# Patient Record
Sex: Male | Born: 1980 | Race: Black or African American | Hispanic: No | Marital: Single | Smoking: Current every day smoker
Health system: Southern US, Community
[De-identification: ages and names within clinical notes are randomized; demographics above are authoritative.]

---

## 2011-02-28 ENCOUNTER — Encounter (HOSPITAL_BASED_OUTPATIENT_CLINIC_OR_DEPARTMENT_OTHER): Payer: Self-pay | Admitting: *Deleted

## 2011-02-28 ENCOUNTER — Emergency Department (HOSPITAL_BASED_OUTPATIENT_CLINIC_OR_DEPARTMENT_OTHER)
Admission: EM | Admit: 2011-02-28 | Discharge: 2011-02-28 | Disposition: A | Discharge status: Home / Self Care | Payer: Self-pay | Attending: Emergency Medicine | Admitting: Emergency Medicine

## 2011-02-28 DIAGNOSIS — F17200 Nicotine dependence, unspecified, uncomplicated: Secondary | ICD-10-CM | POA: Insufficient documentation

## 2011-02-28 DIAGNOSIS — R109 Unspecified abdominal pain: Secondary | ICD-10-CM | POA: Insufficient documentation

## 2011-02-28 LAB — URINALYSIS, ROUTINE W REFLEX MICROSCOPIC
Glucose, UA: NEGATIVE mg/dL
Ketones, ur: NEGATIVE mg/dL
Leukocytes, UA: NEGATIVE
Nitrite: NEGATIVE
pH: 7 (ref 5.0–8.0)

## 2011-02-28 NOTE — ED Provider Notes (Signed)
History  This chart was scribed for Doug Sou, MD by Bennett Scrape. This patient was seen in room MH10/MH10 and the patient's care was started at 4:22PM.  CSN: [id number]  Arrival date & time 02/28/11  1535   First MD Initiated Contact with Patient 02/28/11 1616      Chief Complaint  Patient presents with  . Abdominal Pain    Patient is a 31 y.o. male presenting with flank pain. The history is provided by the patient. No language interpreter was used.  Flank Pain This is a new problem. The current episode started more than 2 days ago. The problem occurs hourly. The problem has not changed since onset.Pertinent negatives include no chest pain, no abdominal pain, no headaches and no shortness of breath. The symptoms are relieved by medications.    Eric Wilkins is a 31 y.o. male who presents to the Emergency Department complaining of one week of gradual onset, non-changing, non-radiating left flank pain described as a tight feeling. He reports that the pain lasts in 2 to 3 minute episodes and goes away for one hour. Pt states that the symptoms have been subsiding since arriving to the ED. Pt states that laying down aggravates the pain and that movement improves the pain. He has been taking Advil and goody powder with moderate improvement in symptoms. He states that he was seen at another ED for similar symptoms last year and was told that the symptoms were muscle spasms. He reports that the previous symptoms resolved on their own. Pt also c/o stools that are softer than normal and oily appearing. Pt states that has been eating a lot of fast food lately. Pt denies dysuria, appetite change, and SOB as associated symptoms. Pt is a smoker and occasional drinker. He denies illegal drug use.   History reviewed. No pertinent past medical history.  History reviewed. No pertinent past surgical history.  History reviewed. No pertinent family history.  History  Substance Use Topics  .  Smoking status: Current Everyday Smoker  . Smokeless tobacco: Not on file  . Alcohol Use: Yes      Review of Systems  Constitutional: Negative for appetite change.  HENT: Negative for sore throat and rhinorrhea.   Respiratory: Negative for cough and shortness of breath.   Cardiovascular: Negative for chest pain.  Gastrointestinal: Negative for abdominal pain.  Genitourinary: Positive for flank pain (Left-sided).  Skin: Negative for rash.  Neurological: Negative for headaches.  All other systems reviewed and are negative.    Allergies  Review of patient's allergies indicates no known allergies.  Home Medications   Current Outpatient Rx  Name Route Sig Dispense Refill  . ASPIRIN-ACETAMINOPHEN 500-325 MG PO PACK Oral Take 1 packet by mouth 2 (two) times daily as needed. For back pain      Triage Vitals: BP 124/85  Pulse 76  Temp(Src) 97.8 F (36.6 C) (Oral)  Resp 18  Ht 5\' 11"  (1.803 m)  Wt 154 lb (69.854 kg)  BMI 21.48 kg/m2  SpO2 99%  Physical Exam  Nursing note and vitals reviewed. Constitutional: He is oriented to person, place, and time. He appears well-developed and well-nourished.  HENT:  Head: Normocephalic and atraumatic.  Mouth/Throat: Oropharynx is clear and moist.  Eyes: Conjunctivae and EOM are normal.  Neck: Normal range of motion. Neck supple.  Cardiovascular: Normal rate, regular rhythm and normal heart sounds.   No murmur heard. Pulmonary/Chest: Effort normal and breath sounds normal. No respiratory distress.  Lungs are clear to auscultation.   Abdominal: Soft. Bowel sounds are normal. He exhibits no distension. There is no tenderness.  Genitourinary: Prostate normal and penis normal.  Musculoskeletal: Normal range of motion. He exhibits tenderness (Slight left flank pain tenderness).  Neurological: He is alert and oriented to person, place, and time. No cranial nerve deficit.  Skin: Skin is warm and dry.  Psychiatric: He has a normal mood  and affect. His behavior is normal.   Pain at left flank is reproduced when patient sits up from a supine position ED Course  Procedures (including critical care time)  DIAGNOSTIC STUDIES: Oxygen Saturation is 99% on room air, normal by my interpretation.    COORDINATION OF CARE: 4:28PM-Advised pt to continue to use goody powders and to return in one week if symptoms haven't resolved. Counseled pt on smoking cessation.     Labs Reviewed  URINALYSIS, ROUTINE W REFLEX MICROSCOPIC   No results found. . Results for orders placed during the hospital encounter of 02/28/11  URINALYSIS, ROUTINE W REFLEX MICROSCOPIC      Component Value Range   Color, Urine YELLOW  YELLOW    APPearance CLEAR  CLEAR    Specific Gravity, Urine 1.017  1.005 - 1.030    pH 7.0  5.0 - 8.0    Glucose, UA NEGATIVE  NEGATIVE (mg/dL)   Hgb urine dipstick NEGATIVE  NEGATIVE    Bilirubin Urine NEGATIVE  NEGATIVE    Ketones, ur NEGATIVE  NEGATIVE (mg/dL)   Protein, ur NEGATIVE  NEGATIVE (mg/dL)   Urobilinogen, UA 0.2  0.0 - 1.0 (mg/dL)   Nitrite NEGATIVE  NEGATIVE    Leukocytes, UA NEGATIVE  NEGATIVE    No results found.   No diagnosis found.  Declines pain medicine in the emergency department  MDM  Pain felt to be muscular in etiology Plan Tylenol or Advil as needed  Diagnosis left flank pain  I personally performed the services described in this documentation, which was scribed in my presence. The recorded information has been reviewed and considered.     Doug Sou, MD 02/28/11 1640

## 2011-02-28 NOTE — ED Notes (Signed)
Pt c/o left side pain, "oily stools, and shaky x 1 week. Denies other s/s.

## 2011-02-28 NOTE — Discharge Instructions (Signed)
We feel that your pain is most likely due to muscle strain. Take Tylenol or Advil for pain. Return or see an urgent care doctor if not improved in 7-10 days

## 2011-07-19 ENCOUNTER — Emergency Department (HOSPITAL_BASED_OUTPATIENT_CLINIC_OR_DEPARTMENT_OTHER)
Admission: EM | Admit: 2011-07-19 | Discharge: 2011-07-19 | Disposition: A | Discharge status: Home / Self Care | Payer: BC Managed Care – PPO | Attending: Emergency Medicine | Admitting: Emergency Medicine

## 2011-07-19 ENCOUNTER — Emergency Department (HOSPITAL_BASED_OUTPATIENT_CLINIC_OR_DEPARTMENT_OTHER): Payer: BC Managed Care – PPO

## 2011-07-19 ENCOUNTER — Encounter (HOSPITAL_BASED_OUTPATIENT_CLINIC_OR_DEPARTMENT_OTHER): Payer: Self-pay | Admitting: *Deleted

## 2011-07-19 DIAGNOSIS — Y9289 Other specified places as the place of occurrence of the external cause: Secondary | ICD-10-CM | POA: Insufficient documentation

## 2011-07-19 DIAGNOSIS — X500XXA Overexertion from strenuous movement or load, initial encounter: Secondary | ICD-10-CM | POA: Insufficient documentation

## 2011-07-19 DIAGNOSIS — IMO0002 Reserved for concepts with insufficient information to code with codable children: Secondary | ICD-10-CM

## 2011-07-19 DIAGNOSIS — S239XXA Sprain of unspecified parts of thorax, initial encounter: Secondary | ICD-10-CM | POA: Insufficient documentation

## 2011-07-19 MED ORDER — KETOROLAC TROMETHAMINE 60 MG/2ML IM SOLN
60.0000 mg | Freq: Once | INTRAMUSCULAR | Status: AC
  Administered 2011-07-19: 60 mg via INTRAMUSCULAR
  Filled 2011-07-19: qty 2

## 2011-07-19 MED ORDER — CYCLOBENZAPRINE HCL 10 MG PO TABS: 10.0000 mg | ORAL_TABLET | Freq: Two times a day (BID) | ORAL | Status: AC | PRN

## 2011-07-19 MED ORDER — HYDROCODONE-ACETAMINOPHEN 5-500 MG PO TABS: 1.0000 | ORAL_TABLET | Freq: Four times a day (QID) | ORAL | Status: AC | PRN

## 2011-07-19 NOTE — Discharge Instructions (Signed)

## 2011-07-19 NOTE — ED Notes (Signed)
Pt c/o pain to neck after tossing a heavy trash bin into a dumpster

## 2011-07-19 NOTE — ED Provider Notes (Addendum)
History   This chart was scribed for Gwyneth Sprout, MD scribed by Magnus Sinning. The patient was seen in room MH03/MH03 seen at 20:34   CSN: [id number]  Arrival date & time 07/19/11  1807   First MD Initiated Contact with Patient 07/19/11 2019      Chief Complaint  Patient presents with  . Neck Pain    (Consider location/radiation/quality/duration/timing/severity/associated sxs/prior treatment) HPI Torell Spiers is a 31 y.o. male who presents to the Emergency Department complaining of constant severe neck pain localized at the center of his neck with associated arm tingling,arm weakness, and arm numbness, onset today. Says he was helping is mother throw heavy trash in the dumpster when he experienced neck pain. 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   History reviewed. No pertinent past medical history.  History reviewed. No pertinent past surgical history.  No family history on file.  History  Substance Use Topics  . Smoking status: Current Everyday Smoker  . Smokeless tobacco: Not on file  . Alcohol Use: Yes      Review of Systems  All other systems reviewed and are negative.   10 Systems reviewed and are negative for acute change except as noted in the HPI. Allergies  Review of patient's allergies indicates no known allergies.  Home Medications   Current Outpatient Rx  Name Route Sig Dispense Refill  . PROMETHAZINE HCL PO Oral Take 1 tablet by mouth once as needed. For nausea      BP 112/88  Pulse 66  Temp(Src) 97.8 F (36.6 C) (Oral)  Resp 16  SpO2 100%  Physical Exam  Nursing note and vitals reviewed. Constitutional: He is oriented to person, place, and time. He appears well-developed and well-nourished. No distress.  HENT:  Head: Normocephalic and atraumatic.  Eyes: EOM are normal.  Pupils are equal, round, and reactive to light.  Neck: Normal range of motion.  Musculoskeletal: Normal range of motion. He exhibits tenderness. He exhibits no edema.       Point tenderness in upper thoracic spine and mild left trapezius tenderness. Normal pulses. No C-spine tenderness. And normal sensation and strength.  Neurological: He is alert and oriented to person, place, and time. No sensory deficit.  Skin: Skin is warm and dry.  Psychiatric: He has a normal mood and affect. His behavior is normal.    ED Course  Procedures (including critical care time) DIAGNOSTIC STUDIES: Oxygen Saturation is 100% on room air, normal by my interpretation.    COORDINATION OF CARE:  Labs Reviewed - No data to display No results found.   1. Thoracic sprain and strain       MDM   Patient with injury to his upper thoracic spine after he was throwing trash and garbage can. He is in severe pain in the upper spine and into his. There is no weakness or numbness on exam. He has normal pulses. He also has some trapezial tenderness but no C-spine tenderness. Plain films are negative. Patient is improved after Toradol and will send him home with pain medication and anti-spasmodic. I personally performed the services described in this documentation, which was scribed in my presence.  The recorded information has been reviewed and considered.         Gwyneth Sprout, MD 07/19/11 2116  Gwyneth Sprout,  MD 07/19/11 2117

## 2012-06-12 ENCOUNTER — Encounter (HOSPITAL_BASED_OUTPATIENT_CLINIC_OR_DEPARTMENT_OTHER): Payer: Self-pay | Admitting: *Deleted

## 2012-06-12 ENCOUNTER — Emergency Department (HOSPITAL_BASED_OUTPATIENT_CLINIC_OR_DEPARTMENT_OTHER): Payer: BC Managed Care – PPO

## 2012-06-12 ENCOUNTER — Emergency Department (HOSPITAL_BASED_OUTPATIENT_CLINIC_OR_DEPARTMENT_OTHER)
Admission: EM | Admit: 2012-06-12 | Discharge: 2012-06-12 | Disposition: A | Discharge status: Home / Self Care | Payer: BC Managed Care – PPO | Attending: Emergency Medicine | Admitting: Emergency Medicine

## 2012-06-12 DIAGNOSIS — S50311A Abrasion of right elbow, initial encounter: Secondary | ICD-10-CM

## 2012-06-12 DIAGNOSIS — IMO0002 Reserved for concepts with insufficient information to code with codable children: Secondary | ICD-10-CM | POA: Insufficient documentation

## 2012-06-12 DIAGNOSIS — Y9389 Activity, other specified: Secondary | ICD-10-CM | POA: Insufficient documentation

## 2012-06-12 DIAGNOSIS — S139XXA Sprain of joints and ligaments of unspecified parts of neck, initial encounter: Secondary | ICD-10-CM | POA: Insufficient documentation

## 2012-06-12 DIAGNOSIS — S161XXA Strain of muscle, fascia and tendon at neck level, initial encounter: Secondary | ICD-10-CM

## 2012-06-12 DIAGNOSIS — Y92410 Unspecified street and highway as the place of occurrence of the external cause: Secondary | ICD-10-CM | POA: Insufficient documentation

## 2012-06-12 DIAGNOSIS — Z23 Encounter for immunization: Secondary | ICD-10-CM | POA: Insufficient documentation

## 2012-06-12 DIAGNOSIS — F17200 Nicotine dependence, unspecified, uncomplicated: Secondary | ICD-10-CM | POA: Insufficient documentation

## 2012-06-12 MED ORDER — IBUPROFEN 800 MG PO TABS
800.0000 mg | ORAL_TABLET | Freq: Once | ORAL | Status: AC
  Administered 2012-06-12: 800 mg via ORAL
  Filled 2012-06-12: qty 1

## 2012-06-12 MED ORDER — TETANUS-DIPHTH-ACELL PERTUSSIS 5-2.5-18.5 LF-MCG/0.5 IM SUSP
0.5000 mL | Freq: Once | INTRAMUSCULAR | Status: AC
  Administered 2012-06-12: 0.5 mL via INTRAMUSCULAR
  Filled 2012-06-12: qty 0.5
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
## 2012-09-01 ENCOUNTER — Encounter (HOSPITAL_BASED_OUTPATIENT_CLINIC_OR_DEPARTMENT_OTHER): Payer: Self-pay

## 2012-09-01 ENCOUNTER — Emergency Department (HOSPITAL_BASED_OUTPATIENT_CLINIC_OR_DEPARTMENT_OTHER)
Admission: EM | Admit: 2012-09-01 | Discharge: 2012-09-01 | Disposition: A | Discharge status: Home / Self Care | Payer: BC Managed Care – PPO | Attending: Emergency Medicine | Admitting: Emergency Medicine

## 2012-09-01 ENCOUNTER — Emergency Department (HOSPITAL_BASED_OUTPATIENT_CLINIC_OR_DEPARTMENT_OTHER): Payer: BC Managed Care – PPO

## 2012-09-01 DIAGNOSIS — F17200 Nicotine dependence, unspecified, uncomplicated: Secondary | ICD-10-CM | POA: Insufficient documentation

## 2012-09-01 DIAGNOSIS — M62838 Other muscle spasm: Secondary | ICD-10-CM | POA: Insufficient documentation

## 2012-09-01 LAB — BASIC METABOLIC PANEL
CO2: 25 mEq/L (ref 19–32)
Calcium: 9.3 mg/dL (ref 8.4–10.5)
Chloride: 102 mEq/L (ref 96–112)
Creatinine, Ser: 1.1 mg/dL (ref 0.50–1.35)
GFR calc Af Amer: >90 mL/min (ref >90)
Sodium: 137 mEq/L (ref 135–145)

## 2012-09-01 MED ORDER — HYDROCODONE-ACETAMINOPHEN 5-325 MG PO TABS: 2.0000 | ORAL_TABLET | ORAL | Status: DC | PRN

## 2012-09-01 MED ORDER — CYCLOBENZAPRINE HCL 10 MG PO TABS: 10.0000 mg | ORAL_TABLET | Freq: Two times a day (BID) | ORAL | Status: DC | PRN

## 2012-09-01 MED ORDER — IOHEXOL 350 MG/ML SOLN
80.0000 mL | Freq: Once | INTRAVENOUS | Status: AC | PRN
  Administered 2012-09-01: 80 mL via INTRAVENOUS

## 2012-09-01 MED ORDER — KETOROLAC TROMETHAMINE 60 MG/2ML IM SOLN
60.0000 mg | Freq: Once | INTRAMUSCULAR | Status: AC
  Administered 2012-09-01: 60 mg via INTRAMUSCULAR

## 2012-09-01 MED ORDER — IBUPROFEN 800 MG PO TABS
800.0000 mg | ORAL_TABLET | Freq: Once | ORAL | Status: AC
  Administered 2012-09-01: 800 mg via ORAL
  Filled 2012-09-01: qty 1

## 2012-09-01 MED ORDER — CYCLOBENZAPRINE HCL 10 MG PO TABS
10.0000 mg | ORAL_TABLET | Freq: Once | ORAL | Status: AC
  Administered 2012-09-01: 10 mg via ORAL
  Filled 2012-09-01: qty 1

## 2012-09-01 MED ORDER — IBUPROFEN 800 MG PO TABS: 800.0000 mg | ORAL_TABLET | Freq: Three times a day (TID) | ORAL | Status: DC

## 2012-09-01 MED ORDER — KETOROLAC TROMETHAMINE 60 MG/2ML IM SOLN
INTRAMUSCULAR | Status: AC
  Administered 2012-09-01: 60 mg via INTRAMUSCULAR
  Filled 2012-09-01: qty 2

## 2012-09-01 NOTE — ED Notes (Signed)
Patient transported to X-ray 
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
## 2012-09-01 NOTE — ED Notes (Signed)
Neck pain that started yesterday.

## 2014-03-20 ENCOUNTER — Emergency Department (HOSPITAL_BASED_OUTPATIENT_CLINIC_OR_DEPARTMENT_OTHER)
Admission: EM | Admit: 2014-03-20 | Discharge: 2014-03-20 | Disposition: A | Discharge status: Home / Self Care | Payer: Managed Care, Other (non HMO) | Attending: Emergency Medicine | Admitting: Emergency Medicine

## 2014-03-20 ENCOUNTER — Encounter (HOSPITAL_BASED_OUTPATIENT_CLINIC_OR_DEPARTMENT_OTHER): Payer: Self-pay

## 2014-03-20 DIAGNOSIS — Z720 Tobacco use: Secondary | ICD-10-CM | POA: Diagnosis not present

## 2014-03-20 DIAGNOSIS — M25532 Pain in left wrist: Secondary | ICD-10-CM | POA: Diagnosis not present

## 2014-03-20 MED ORDER — NAPROXEN 500 MG PO TABS: 500.0000 mg | ORAL_TABLET | Freq: Two times a day (BID) | ORAL | Status: DC

## 2014-03-20 NOTE — ED Notes (Signed)
Reports pain to left wrist and hand. Also sts numbness extending up to elbow

## 2014-03-20 NOTE — Discharge Instructions (Signed)
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
## 2014-10-07 ENCOUNTER — Emergency Department (HOSPITAL_BASED_OUTPATIENT_CLINIC_OR_DEPARTMENT_OTHER): Payer: BLUE CROSS/BLUE SHIELD

## 2014-10-07 ENCOUNTER — Emergency Department (HOSPITAL_BASED_OUTPATIENT_CLINIC_OR_DEPARTMENT_OTHER)
Admission: EM | Admit: 2014-10-07 | Discharge: 2014-10-07 | Disposition: A | Discharge status: Home / Self Care | Payer: BLUE CROSS/BLUE SHIELD | Attending: Emergency Medicine | Admitting: Emergency Medicine

## 2014-10-07 ENCOUNTER — Encounter (HOSPITAL_BASED_OUTPATIENT_CLINIC_OR_DEPARTMENT_OTHER): Payer: Self-pay

## 2014-10-07 DIAGNOSIS — Y998 Other external cause status: Secondary | ICD-10-CM | POA: Insufficient documentation

## 2014-10-07 DIAGNOSIS — W108XXA Fall (on) (from) other stairs and steps, initial encounter: Secondary | ICD-10-CM | POA: Diagnosis not present

## 2014-10-07 DIAGNOSIS — S299XXA Unspecified injury of thorax, initial encounter: Secondary | ICD-10-CM | POA: Diagnosis present

## 2014-10-07 DIAGNOSIS — Z720 Tobacco use: Secondary | ICD-10-CM | POA: Diagnosis not present

## 2014-10-07 DIAGNOSIS — W19XXXA Unspecified fall, initial encounter: Secondary | ICD-10-CM

## 2014-10-07 DIAGNOSIS — S20212A Contusion of left front wall of thorax, initial encounter: Secondary | ICD-10-CM | POA: Insufficient documentation

## 2014-10-07 DIAGNOSIS — Y9301 Activity, walking, marching and hiking: Secondary | ICD-10-CM | POA: Insufficient documentation

## 2014-10-07 DIAGNOSIS — Y92009 Unspecified place in unspecified non-institutional (private) residence as the place of occurrence of the external cause: Secondary | ICD-10-CM | POA: Diagnosis not present

## 2014-10-07 MED ORDER — HYDROCODONE-ACETAMINOPHEN 5-325 MG PO TABS: ORAL_TABLET | ORAL | Status: DC

## 2014-10-07 MED ORDER — OXYCODONE-ACETAMINOPHEN 5-325 MG PO TABS
1.0000 | ORAL_TABLET | Freq: Once | ORAL | Status: AC
  Administered 2014-10-07: 1 via ORAL
  Filled 2014-10-07: qty 1

## 2014-10-07 NOTE — ED Notes (Signed)
Patient here with left side pain after tripping and falling down 5 steps this am, no loc, no acute distress

## 2014-10-07 NOTE — ED Provider Notes (Signed)
CSN: [id number]     Arrival date & time 10/07/14  1142 History   First MD Initiated Contact with Patient 10/07/14 1201     Chief Complaint  Patient presents with  . Fall     (Consider location/radiation/quality/duration/timing/severity/associated sxs/prior Treatment) HPI   Blood pressure 127/87, pulse 79, temperature 98.3 F (36.8 C), temperature source Oral, resp. rate 20, height  (1.803 m), weight 148 lb (67.132 kg), SpO2 100 %.  Eric Wilkins is a 34 y.o. male complaining of left lower lateral rib pain status post slip and fall, patient was walking out of his house and his flip-flop got caught on the door mat. He fell down 4-5 steps and fell onto his daughter's tricycle. There was no head trauma, loss of consciousness, cervicalgia. Patient states he feels severe pain when he takes a deep breath, no pain medication taken prior to arrival. He denies any joint pain, difficulty ambulating.  History reviewed. No pertinent past medical history. History reviewed. No pertinent past surgical history. No family history on file. Social History  Substance Use Topics  . Smoking status: Current Every Day Smoker  . Smokeless tobacco: None  . Alcohol Use: Yes     Comment: occasional    Review of Systems  10 systems reviewed and found to be negative, except as noted in the HPI.   Allergies  Review of patient's allergies indicates no known allergies.  Home Medications   Prior to Admission medications   Not on File   BP 127/87 mmHg  Pulse 79  Temp(Src) 98.3 F (36.8 C) (Oral)  Resp 20  Ht  (1.803 m)  Wt 148 lb (67.132 kg)  BMI 20.65 kg/m2  SpO2 100% Physical Exam  Constitutional: He is oriented to person, place, and time. He appears well-developed and well-nourished. No distress.  HENT:  Head: Normocephalic.  Mouth/Throat: Oropharynx is clear and moist.  Eyes: Conjunctivae and EOM are normal. Pupils are equal, round, and reactive to light.  Cardiovascular:  Normal rate.   Pulmonary/Chest: Effort normal and breath sounds normal. No stridor. No respiratory distress. He has no wheezes. He has no rales. He exhibits tenderness.    Musculoskeletal: Normal range of motion.  Neurological: He is alert and oriented to person, place, and time.  Psychiatric: He has a normal mood and affect.  Nursing note and vitals reviewed.   ED Course  Procedures (including critical care time) Labs Review Labs Reviewed - No data to display  Imaging Review No results found. I have personally reviewed and evaluated these images and lab results as part of my medical decision-making.   EKG Interpretation None      MDM   Final diagnoses:  Fall at home, initial encounter  Rib contusion, left, initial encounter    Filed Vitals:   10/07/14 1147  BP: 127/87  Pulse: 79  Temp: 98.3 F (36.8 C)  TempSrc: Oral  Resp: 20  Height:  (1.803 m)  Weight: 148 lb (67.132 kg)  SpO2: 100%    Medications  oxyCODONE-acetaminophen (PERCOCET/ROXICET) 5-325 MG per tablet 1 tablet (1 tablet Oral Given 10/07/14 1248)    Eric Wilkins is a pleasant 34 y.o. male presenting with left lateral low rib pain status post slip and fall at home. He fell down approximately 5 steps onto his daughter's 3 wheeler. There was no other injuries in the fall. Lung sounds are clear, patient saturating well on room air, there is no crepitance on my exam. X-ray negative. Patient is given  an incentive spirometer.   Evaluation does not show pathology that would require ongoing emergent intervention or inpatient treatment. Pt is hemodynamically stable and mentating appropriately. Discussed findings and plan with patient/guardian, who agrees with care plan. All questions answered. Return precautions discussed and outpatient follow up given.   New Prescriptions   HYDROCODONE-ACETAMINOPHEN (NORCO/VICODIN) 5-325 MG PER TABLET    Take 1-2 tablets by mouth every 6 hours as needed for pain and/or  cough.         Wynetta Emery, PA-C 10/07/14 1317  Jerelyn Scott, MD 10/07/14 [phone]

## 2014-10-07 NOTE — Discharge Instructions (Signed)
Take vicodin for breakthrough pain, do not drink alcohol, drive, care for children or do other critical tasks while taking vicodin.  It is very important that you take deep breaths to prevent lung collapse and infection.  Take 10 deep breaths every hour to prevent lung collapse.  If you develop cough, fever or shortness of breath return immediately to the emergency room.  Do not hesitate to return to the emergency room for any new, worsening or concerning symptoms.  Please obtain primary care using resource guide below. Let them know that you were seen in the emergency room and that they will need to obtain records for further outpatient management.   Chest Contusion A contusion is a deep bruise. Bruises happen when an injury causes bleeding under the skin. Signs of bruising include pain, puffiness (swelling), and discolored skin. The bruise may turn blue, purple, or yellow.  HOME CARE  Put ice on the injured area.  Put ice in a plastic bag.  Place a towel between the skin and the bag.  Leave the ice on for 15-20 minutes at a time, 03-04 times a day for the first 48 hours.  Only take medicine as told by your doctor.  Rest.  Take deep breaths (deep-breathing exercises) as told by your doctor.  Stop smoking if you smoke.  Do not lift objects over 5 pounds (2.3 kilograms) for 3 days or longer if told by your doctor. GET HELP RIGHT AWAY IF:   You have more bruising or puffiness.  You have pain that gets worse.  You have trouble breathing.  You are dizzy, weak, or pass out (faint).  You have blood in your pee (urine) or poop (stool).  You cough up or throw up (vomit) blood.  Your puffiness or pain is not helped with medicines. MAKE SURE YOU:   Understand these instructions.  Will watch your condition.  Will get help right away if you are not doing well or get worse. Document Released: 07/22/2007 Document Revised: 10/28/2011 Document Reviewed: 07/27/2011 [facility]  Patient Information 2015 [place], [place]. This information is not intended to replace advice given to you by your health care provider. Make sure you discuss any questions you have with your health care provider.   Emergency Department Resource Guide 1) Find a Doctor and Pay Out of Pocket Although you won't have to find out who is covered by your insurance plan, it is a good idea to ask around and get recommendations. You will then need to call the office and see if the doctor you have chosen will accept you as a new patient and what types of options they offer for patients who are self-pay. Some doctors offer discounts or will set up payment plans for their patients who do not have insurance, but you will need to ask so you aren't surprised when you get to your appointment.  2) Contact Your Local Health Department Not all health departments have doctors that can see patients for sick visits, but many do, so it is worth a call to see if yours does. If you don't know where your local health department is, you can check in your phone book. The CDC also has a tool to help you locate your state's health department, and many state websites also have listings of all of their local health departments.  3) Find a Walk-in Clinic If your illness is not likely to be very severe or complicated, you may want to try a walk in clinic. These are  popping up all over the country in pharmacies, drugstores, and shopping centers. They're usually staffed by nurse practitioners or physician assistants that have been trained to treat common illnesses and complaints. They're usually fairly quick and inexpensive. However, if you have serious medical issues or chronic medical problems, these are probably not your best option.  No Primary Care Doctor: - Call Health Connect at  [phone] - they can help you locate a primary care doctor that  accepts your insurance, provides certain services, etc. - Physician Referral Service-  [phone]  Chronic Pain Problems: Organization         Address  Phone   Notes  [facility]  [phone] Patients need to be referred by their primary care doctor.   Medication Assistance: Organization         Address  Phone   Notes  [facility] Medication [facility] [address] [place] [place], [place] [address] [phone] --Must be a resident of [facility] -- Must have NO insurance coverage whatsoever (no Medicaid/ Medicare, etc.) -- The pt. MUST have a primary care doctor that directs their care regularly and follows them in the community   [facility]  [phone]   [employer]  [phone]    Agencies that provide inexpensive medical care: Organization         Address  Phone   Notes  [facility]  [phone]   [facility]    [phone]   [facility] [address] [place], [place] [address] [phone]   [facility] [address], [place] [phone]   [facility]    [phone]   [facility]    [phone]   [facility]  [address], [place] Phone:  [phone], Fax:  [phone] Hours of Operation:  9 am - 6 pm, M-F.  Also accepts Medicaid/Medicare and self-pay.  [facility]  [address], [place] Phone: [phone], Fax: [phone]. Hours of Operation:  8:30 am - 5:30 pm, M-F.  Also accepts Medicaid and self-pay.  [facility] [place] [address], [place] Phone: [phone]   [facility] [address] Natasha Bence [place], [place] [phone], Ext. [phone] Mondays & Thursdays: 7-9 AM.  First 15 patients are seen on a first come, first serve basis.    Medicaid-accepting [facility] Providers:  Organization         Address  Phone   Notes  [facility] [address],  [place] [phone] Also accepts self-pay patients.  [facility] [address] Josephs [place], [place]  [phone]   [facility] [address], [place] [phone]   [facility] [address] [place] [phone]   Renaye Rakers [address], [place]   [phone] Only accepts [place] Access [place] patients after they have their name applied to their card.   Self-Pay (no insurance) in [facility]:  Organization         Address  Phone   Notes  Sickle Cell Patients, [facility] Medicine [address] [place], [place] [phone]   [facility] [address] [place], [place] [phone]   [facility]  [address], [place] [phone]   [facility]/Dr. Osei-Bonsu  [address] [place] or [address], [place] [phone] Phone number for both [place] and [place] locations is the same.  [facility] [address], [place] [phone]   [facility] [address], [place] or [address] [phone]   [facility] [address] [place] [phone], phone; [phone], fax Sees patients 1st and 3rd Saturday of every month.  Must not qualify for public or private insurance (i.e. Medicaid, Medicare, [place] Health Choice, Veterans' Benefits)  Household income should be no more than 200% of the poverty level The clinic cannot treat you if you are pregnant or think you are pregnant  Sexually transmitted diseases are not treated at the clinic.    Dental Care: Organization         Address  Phone  Notes  [facility] Department of [facility] [address] [place], [place] [phone] Accepts children up to age 71 who are enrolled in  [place] or [place] Health Choice; pregnant women with a Medicaid card; and children who have applied for Medicaid or [place] Health Choice, but were declined, whose parents can pay a reduced fee at time of service.  [facility] Department of [facility]  [address] [place] [address], [place] [phone] Accepts children up to age 7 who are enrolled in [place] or [place] Health Choice; pregnant women with a Medicaid card; and children who have applied for Medicaid or [place] Health Choice, but were declined, whose parents can pay a reduced fee at time of service.  [facility]  [address] [place], [place] [phone] Patients are seen by appointment only. Walk-ins are not accepted. [facility] will see patients 67 years of age and older. Monday - Tuesday (8am-5pm) Most Wednesdays (8:30-5pm) $30 per visit, cash only  [facility] Adult Dental Access PROGRAM  [address], [facility] [phone] Patients are seen by appointment only. Walk-ins are not accepted. [facility] will see patients 39 years of age and older. One Wednesday Evening (Monthly: Volunteer Based).  $30 per visit, cash only  [employer]  [phone] for adults; Children under age 54, call Graduate Pediatric Dentistry at [phone]. Children aged 3-14, please call [phone] to request a pediatric application.  Dental services are provided in all areas of dental care including fillings, crowns and bridges, complete and partial dentures, implants, gum treatment, root canals, and extractions. Preventive care is also provided. Treatment is provided to both adults and children. Patients are selected via a lottery and there is often a waiting list.   [facility] [address], [place]  [phone] [email or web address]   [facility] [address] [place], [place] [phone], Ext. [phone] Second and Fourth Thursday of each month, opens at 6:30  AM; Clinic ends at 9 AM.  Patients are seen on a first-come first-served basis, and a limited number are seen during each clinic.   [facility]  [address] Ether [place]ins [place], [place] [phone]   Eligibility Requirements You must have lived in [place], [place], or [place] counties for at least the last three months.   You cannot be eligible for state or federal sponsored [place], including [employer], [place],  or Medicare.   You generally cannot be eligible for healthcare insurance through your employer.    How to apply: Eligibility screenings are held every Tuesday and Wednesday afternoon from 1:00 pm until 4:00 pm. You do not need an appointment for the interview!  [address] [facility] [address], [place], [place] [phone]   [facility] Health Department  [phone]   [facility] Health Department  [phone]   [facility] Department  [phone]    Behavioral Health Resources in the Community: Intensive Outpatient Programs Organization         Address  Phone  Notes  [facility] [address] [place], [place] [phone]   [facility] Outpatient [address], [place], [place] [phone]   [facility] [address], [place], [place]  [phone]   [facility] [address],  [place], [place] [phone] or [phone]   Substance Abuse Resources Organization         Address  Phone  Notes  [facility]  [phone]   [facility]  [phone]   [facility] [place]  [phone]   Floydene Flock  [phone]   Residential & Outpatient Substance Abuse Program  [phone]   Psychological Services Organization         Address  Phone  Notes  [facility] Behavioral Health  [phone]   [facility] Services  [phone]   [facility] Mental Health [address] [place] [phone] or  [phone]    Mobile Crisis Teams Organization         Address  Phone  Notes  [facility], Mobile Crisis Care Unit  [phone]   [facility]  [address] [place], [place] [phone]   Doristine Locks [address] [place] [phone]    Self-Help/Support Groups Organization         Address  Phone             Notes  [facility] [place] - variety of support groups  336- I7437963 Call for more information  Narcotics Anonymous (NA), [facility] [address], [employer] [place]  2 meetings at this location   Statistician         Address  Phone  Notes  [facility] [address],    [place]  [phone]   [facility]  [address] [place] [address], [place], [place] [phone]   [facility] [address] [place], [place] [phone] Admissions: 8am-3pm M-F  [facility] [address]    [place], [place] [phone]   [facility] [address] [place], [place], [place] [phone]   [facility] [address],  [place], [place] [phone]   [facility] - Intensive Outpatient [address], [place], [place] [phone]   [facility] ([facility] [address] [place]  [place], [place] [phone] or [phone]   [facility] ([facility]) [address] [place], [place] [phone] Accepts Medicaid  [facility] [address],  [place] [phone] Substance Abuse/Addiction Treatment   [facility] Organization         Address  Phone  Notes  [facility]  [phone]   Angie Fava, PhD [address] [place], [place]   [phone] or [phone]   [facility]al   [address] [place], [place] [phone]   [facility] [address],  [place], [place] [phone] Insurance/Medicaid/sponsorship through [facility] and Families [address]                                    [place], [place] [phone] Therapy/tele-psych/case  [facility] [address]   [place], [place] [phone]    Dr. Lolly Mustache  [phone]   [facility]  [employer] [facility] Dept. 1) [address], [place] 2) [address] [place] 3)  [address], [place] [phone]  [phone]   [facility] Child Abuse Hotline 931-633-7909 or [phone])[phone] (After Hours)

## 2015-02-04 ENCOUNTER — Encounter (HOSPITAL_BASED_OUTPATIENT_CLINIC_OR_DEPARTMENT_OTHER): Payer: Self-pay | Admitting: *Deleted

## 2015-02-04 ENCOUNTER — Emergency Department (HOSPITAL_BASED_OUTPATIENT_CLINIC_OR_DEPARTMENT_OTHER)
Admission: EM | Admit: 2015-02-04 | Discharge: 2015-02-04 | Disposition: A | Discharge status: Home / Self Care | Payer: BLUE CROSS/BLUE SHIELD | Attending: Emergency Medicine | Admitting: Emergency Medicine

## 2015-02-04 DIAGNOSIS — Y9389 Activity, other specified: Secondary | ICD-10-CM | POA: Insufficient documentation

## 2015-02-04 DIAGNOSIS — Y998 Other external cause status: Secondary | ICD-10-CM | POA: Insufficient documentation

## 2015-02-04 DIAGNOSIS — F17210 Nicotine dependence, cigarettes, uncomplicated: Secondary | ICD-10-CM | POA: Insufficient documentation

## 2015-02-04 DIAGNOSIS — M62830 Muscle spasm of back: Secondary | ICD-10-CM | POA: Diagnosis not present

## 2015-02-04 DIAGNOSIS — S0990XA Unspecified injury of head, initial encounter: Secondary | ICD-10-CM | POA: Insufficient documentation

## 2015-02-04 DIAGNOSIS — X509XXA Other and unspecified overexertion or strenuous movements or postures, initial encounter: Secondary | ICD-10-CM | POA: Insufficient documentation

## 2015-02-04 DIAGNOSIS — M546 Pain in thoracic spine: Secondary | ICD-10-CM | POA: Diagnosis present

## 2015-02-04 DIAGNOSIS — Y9289 Other specified places as the place of occurrence of the external cause: Secondary | ICD-10-CM | POA: Insufficient documentation

## 2015-02-04 DIAGNOSIS — S199XXA Unspecified injury of neck, initial encounter: Secondary | ICD-10-CM | POA: Insufficient documentation

## 2015-02-04 MED ORDER — BETAMETHASONE SOD PHOS & ACET 6 (3-3) MG/ML IJ SUSP
12.0000 mg | Freq: Once | INTRAMUSCULAR | Status: DC
  Filled 2015-02-04: qty 2

## 2015-02-04 MED ORDER — CYCLOBENZAPRINE HCL 10 MG PO TABS: 10.0000 mg | ORAL_TABLET | Freq: Two times a day (BID) | ORAL | Status: DC | PRN

## 2015-02-04 MED ORDER — BUPIVACAINE HCL (PF) 0.5 % IJ SOLN: 10.0000 mL | Freq: Once | INTRAMUSCULAR | Status: DC

## 2015-02-04 MED ORDER — BUPIVACAINE HCL (PF) 0.5 % IJ SOLN
10.0000 mL | Freq: Once | INTRAMUSCULAR | Status: AC
  Administered 2015-02-04: 10 mL
  Filled 2015-02-04: qty 10

## 2015-02-04 MED ORDER — NAPROXEN 500 MG PO TABS: 500.0000 mg | ORAL_TABLET | Freq: Two times a day (BID) | ORAL | Status: DC

## 2015-02-04 MED ORDER — METHYLPREDNISOLONE SODIUM SUCC 40 MG IJ SOLR
40.0000 mg | Freq: Once | INTRAMUSCULAR | Status: AC
  Administered 2015-02-04: 40 mg via INTRAVENOUS
  Filled 2015-02-04: qty 1

## 2015-02-04 NOTE — ED Notes (Signed)
MD at bedside. 

## 2015-02-04 NOTE — ED Notes (Signed)
rx x 2 given for naprosyn and flexeril- note for work provided

## 2015-02-04 NOTE — Discharge Instructions (Signed)
Trigger Point Injection Trigger points are areas where you have muscle pain. A trigger point injection is a shot given in the trigger point to relieve that pain. A trigger point might feel like a knot in your muscle. It hurts to press on a trigger point. Sometimes the pain spreads out (radiates) to other parts of the body. For example, pressing on a trigger point in your shoulder might cause pain in your arm or neck. You might have one trigger point. Or, you might have more than one. People often have trigger points in their upper back and lower back. They also occur often in the neck and shoulders. Pain from a trigger point lasts for a long time. It can make it hard to keep moving. You might not be able to do the exercise or physical therapy that could help you deal with the pain. A trigger point injection may help. It does not work for everyone. But, it may relieve your pain for a few days or a few months. A trigger point injection does not cure long-lasting (chronic) pain. LET YOUR CAREGIVER KNOW ABOUT:  Any allergies (especially to latex, lidocaine, or steroids).  Blood-thinning medicines that you take. These drugs can lead to bleeding or bruising after an injection. They include:  Aspirin.  Ibuprofen.  Clopidogrel.  Warfarin.  Other medicines you take. This includes all vitamins, herbs, eyedrops, over-the-counter medicines, and creams.  Use of steroids.  Recent infections.  Past problems with numbing medicines.  Bleeding problems.  Surgeries you have had.  Other health problems. RISKS AND COMPLICATIONS A trigger point injection is a safe treatment. However, problems may develop, such as:  Minor side effects usually go away in 1 to 2 days. These may include:  Soreness.  Bruising.  Stiffness.  More serious problems are rare. But, they may include:  Bleeding under the skin (hematoma).  Skin infection.  Breaking off of the needle under your skin.  Lung  puncture.  The trigger point injection may not work for you. BEFORE THE PROCEDURE You may need to stop taking any medicine that thins your blood. This is to prevent bleeding and bruising. Usually these medicines are stopped several days before the injection. No other preparation is needed. PROCEDURE  A trigger point injection can be given in your caregiver's office or in a clinic. Each injection takes 2 minutes or less.  Your caregiver will feel for trigger points. The caregiver may use a marker to circle the area for the injection.  The skin over the trigger point will be washed with a germ-killing (antiseptic) solution.  The caregiver pinches the spot for the injection.  Then, a very thin needle is used for the shot. You may feel pain or a twitching feeling when the needle enters the trigger point.  A numbing solution may be injected into the trigger point. Sometimes a drug to keep down swelling, redness, and warmth (inflammation) is also injected.  Your caregiver moves the needle around the trigger zone until the tightness and twitching goes away.  After the injection, your caregiver may put gentle pressure over the injection site.  Then it is covered with a bandage. AFTER THE PROCEDURE  You can go right home after the injection.  The bandage can be taken off after a few hours.  You may feel sore and stiff for 1 to 2 days.  Go back to your regular activities slowly. Your caregiver may ask you to stretch your muscles. Do not do anything that takes   extra energy for a few days.  Follow your caregiver's instructions to manage and treat other pain.   This information is not intended to replace advice given to you by your health care provider. Make sure you discuss any questions you have with your health care provider.   Document Released: 01/22/2011 Document Revised: 05/30/2012 Document Reviewed: 01/22/2011 Elsevier Interactive Patient Education 2016 [employer].  
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
## 2015-02-04 NOTE — ED Notes (Signed)
Pain in his upper back on and off for 2 years. This am the pain caught him and caused him to become light headed and have a headache.

## 2015-09-19 ENCOUNTER — Emergency Department (HOSPITAL_BASED_OUTPATIENT_CLINIC_OR_DEPARTMENT_OTHER)
Admission: EM | Admit: 2015-09-19 | Discharge: 2015-09-19 | Disposition: A | Discharge status: Home / Self Care | Payer: BLUE CROSS/BLUE SHIELD | Attending: Physician Assistant | Admitting: Physician Assistant

## 2015-09-19 ENCOUNTER — Encounter (HOSPITAL_BASED_OUTPATIENT_CLINIC_OR_DEPARTMENT_OTHER): Payer: Self-pay | Admitting: Emergency Medicine

## 2015-09-19 DIAGNOSIS — M62830 Muscle spasm of back: Secondary | ICD-10-CM | POA: Diagnosis not present

## 2015-09-19 DIAGNOSIS — F17210 Nicotine dependence, cigarettes, uncomplicated: Secondary | ICD-10-CM | POA: Insufficient documentation

## 2015-09-19 DIAGNOSIS — M549 Dorsalgia, unspecified: Secondary | ICD-10-CM | POA: Diagnosis present

## 2015-09-19 MED ORDER — CYCLOBENZAPRINE HCL 10 MG PO TABS: 10.0000 mg | ORAL_TABLET | Freq: Two times a day (BID) | ORAL | 0 refills | Status: DC | PRN

## 2015-09-19 MED ORDER — IBUPROFEN 800 MG PO TABS: 800.0000 mg | ORAL_TABLET | Freq: Three times a day (TID) | ORAL | 0 refills | Status: DC

## 2015-09-19 MED ORDER — IBUPROFEN 800 MG PO TABS
800.0000 mg | ORAL_TABLET | Freq: Once | ORAL | Status: AC
Start: 2015-09-19 — End: 2015-09-19
  Administered 2015-09-19: 800 mg via ORAL
  Filled 2015-09-19: qty 1

## 2015-09-19 MED ORDER — OXYCODONE-ACETAMINOPHEN 5-325 MG PO TABS: 1.0000 | ORAL_TABLET | Freq: Four times a day (QID) | ORAL | 0 refills | Status: DC | PRN

## 2015-09-19 MED ORDER — CYCLOBENZAPRINE HCL 10 MG PO TABS
10.0000 mg | ORAL_TABLET | Freq: Once | ORAL | Status: AC
Start: 2015-09-19 — End: 2015-09-19
  Administered 2015-09-19: 10 mg via ORAL
  Filled 2015-09-19: qty 1

## 2015-09-19 MED FILL — IBUPROFEN 800 MG TABLET: 800 | 7 days supply | Qty: 21 | Fill #0

## 2015-09-19 MED FILL — OXYCODONE/APAP 5-325: 5-325 | 1 days supply | Qty: 3 | Fill #0

## 2015-09-19 MED FILL — CYCLOBENZAPRINE 10 MG TAB: 10 | 5 days supply | Qty: 10 | Fill #0

## 2015-09-19 NOTE — ED Provider Notes (Signed)
[facility]   CSN: [id number] Arrival date & time: 09/19/15  3254  First Provider Contact:  None       History   Chief Complaint Chief Complaint  Patient presents with  . Back Pain    HPI Eric Wilkins is a 35 y.o. male.  HPI   Patient's a pleasant  35 year old male presenting with muscle spasms to the left trapeziust. Patient has been here for this prior and had a trigger point injection which helped. Patient has had Flexeril in the past was also helped.  No numbness tingling or weakness. No recent trauma.  History reviewed. No pertinent past medical history.  There are no active problems to display for this patient.   History reviewed. No pertinent surgical history.     Home Medications    Prior to Admission medications   Medication Sig Start Date End Date Taking? Authorizing Provider  cyclobenzaprine (FLEXERIL) 10 MG tablet Take 1 tablet (10 mg total) by mouth 2 (two) times daily as needed for muscle spasms. 02/04/15   Arthor Captain, PA-C  HYDROcodone-acetaminophen (NORCO/VICODIN) 5-325 MG per tablet Take 1-2 tablets by mouth every 6 hours as needed for pain and/or cough. 10/07/14   Nicole Pisciotta, PA-C  naproxen (NAPROSYN) 500 MG tablet Take 1 tablet (500 mg total) by mouth 2 (two) times daily with a meal. 02/04/15   Arthor Captain, PA-C    Family History No family history on file.  Social History Social History  Substance Use Topics  . Smoking status: Current Every Day Smoker    Packs/day: 0.50    Types: Cigarettes  . Smokeless tobacco: Never Used  . Alcohol use Yes     Comment: occasional     Allergies   Review of patient's allergies indicates no known allergies.   Review of Systems Review of Systems  Constitutional: Negative for activity change.  Respiratory: Negative for shortness of breath.   Cardiovascular: Negative for chest pain.  Gastrointestinal: Negative for abdominal pain.  Musculoskeletal: Positive  for back pain.     Physical Exam Updated Vital Signs BP 111/87 (BP Location: Left Arm)   Pulse 67   Temp 98.3 F (36.8 C) (Oral)   Resp 18   Ht 5\' 11"  (1.803 m)   Wt 150 lb (68 kg)   SpO2 100%   BMI 20.92 kg/m   Physical Exam  Constitutional: He is oriented to person, place, and time. He appears well-nourished.  HENT:  Head: Normocephalic.  Eyes: Conjunctivae are normal.  Cardiovascular: Normal rate.   Pulmonary/Chest: Effort normal.  Musculoskeletal:  Tightness in the muscle of the left trap. Full range motion no external trauma.  Neurological: He is oriented to person, place, and time.  Skin: Skin is warm and dry. He is not diaphoretic.  Psychiatric: He has a normal mood and affect. His behavior is normal.     ED Treatments / Results  Labs (all labs ordered are listed, but only abnormal results are displayed) Labs Reviewed - No data to display  EKG  EKG Interpretation None       Radiology No results found.  Procedures Procedures (including critical care time)  Medications Ordered in ED Medications  cyclobenzaprine (FLEXERIL) tablet 10 mg (not administered)  ibuprofen (ADVIL,MOTRIN) tablet 800 mg (not administered)     Initial Impression / Assessment and Plan / ED Course  I have reviewed the triage vital signs and the nursing notes.  Pertinent labs & imaging results that were available during my  care of the patient were reviewed by me and considered in my medical decision making (see chart for details).  Clinical Course  Patient's a pleasant 35 year old male presenting with muscle spasm in the left trapezius. We'll treat with Flexeril and ibuprofen and have him follow-up with a primary care physician.    Final Clinical Impressions(s) / ED Diagnoses   Final diagnoses:  None    New Prescriptions New Prescriptions   No medications on file     Katesha Eichel Randall An, MD 09/19/15 1005

## 2015-09-19 NOTE — ED Triage Notes (Signed)
Pt reports upper back pain between the shoulder blades since yesterday. Pt states this is a recurring problem. Denies recent injury.

## 2020-02-22 ENCOUNTER — Emergency Department (HOSPITAL_BASED_OUTPATIENT_CLINIC_OR_DEPARTMENT_OTHER): Payer: Managed Care, Other (non HMO)

## 2020-02-22 ENCOUNTER — Encounter (HOSPITAL_BASED_OUTPATIENT_CLINIC_OR_DEPARTMENT_OTHER): Payer: Self-pay | Admitting: *Deleted

## 2020-02-22 ENCOUNTER — Other Ambulatory Visit: Payer: Self-pay

## 2020-02-22 ENCOUNTER — Emergency Department (HOSPITAL_BASED_OUTPATIENT_CLINIC_OR_DEPARTMENT_OTHER)
Admission: EM | Admit: 2020-02-22 | Discharge: 2020-02-22 | Disposition: A | Discharge status: Home / Self Care | Payer: Managed Care, Other (non HMO) | Attending: Emergency Medicine | Admitting: Emergency Medicine

## 2020-02-22 DIAGNOSIS — Y9301 Activity, walking, marching and hiking: Secondary | ICD-10-CM | POA: Insufficient documentation

## 2020-02-22 DIAGNOSIS — W010XXA Fall on same level from slipping, tripping and stumbling without subsequent striking against object, initial encounter: Secondary | ICD-10-CM | POA: Insufficient documentation

## 2020-02-22 DIAGNOSIS — F17210 Nicotine dependence, cigarettes, uncomplicated: Secondary | ICD-10-CM | POA: Insufficient documentation

## 2020-02-22 DIAGNOSIS — S99922A Unspecified injury of left foot, initial encounter: Secondary | ICD-10-CM | POA: Insufficient documentation

## 2020-02-22 DIAGNOSIS — Y92008 Other place in unspecified non-institutional (private) residence as the place of occurrence of the external cause: Secondary | ICD-10-CM | POA: Insufficient documentation

## 2020-02-22 NOTE — Discharge Instructions (Addendum)
Seen here for injury to your left foot.  Exam and imaging all looks reassuring.  I recommend over-the-counter pain medications like ibuprofen or Tylenol every 6 hours as needed please following dosing back of bottle.  You may also apply heat or ice over the area which can help with swelling inflammation.  Recommend follow-up with your PCP in 1 to 2 weeks if symptoms not fully resolved.  Come back to the emergency department if you develop chest pain, shortness of breath, severe abdominal pain, uncontrolled nausea, vomiting, diarrhea.

## 2020-02-22 NOTE — ED Provider Notes (Signed)
[facility] EMERGENCY DEPARTMENT Provider Note   CSN: [id number] Arrival date & time: 02/22/20  1442     History Chief Complaint  Patient presents with  . Foot Injury    Eric Wilkins is a 40 y.o. male.  HPI    Patient with no significant medical history presents to the emergency department with chief complaint of left foot pain.  Patient states pain started yesterday after a brick hit his foot.  Patient states he was walking outside in flip-flops and stepped on a loose brick which flipped up and hit him on the top of his foot.  He denies paresthesia or weakness in his foot, states he has a dull sensation on the top of his foot which worsens with movement.  He denies paresthesia or weakness in the foot.  Patient denies hitting his head, lose conscious, is not on anticoags.  He denies  alleviating factors.  Patient denies headaches, fevers, chills, shortness of breath, chest pain, abdominal pain, nausea, vomiting, diarrhea, pedal edema.  History reviewed. No pertinent past medical history.  There are no problems to display for this patient.   History reviewed. No pertinent surgical history.     No family history on file.  Social History   Tobacco Use  . Smoking status: Current Every Day Smoker    Packs/day: 0.50    Types: Cigarettes  . Smokeless tobacco: Never Used  Substance Use Topics  . Alcohol use: Yes    Comment: occasional  . Drug use: No    Home Medications Prior to Admission medications   Medication Sig Start Date End Date Taking? Authorizing Provider  cyclobenzaprine (FLEXERIL) 10 MG tablet Take 1 tablet (10 mg total) by mouth 2 (two) times daily as needed for muscle spasms. 02/04/15   Arthor Captain, PA-C  cyclobenzaprine (FLEXERIL) 10 MG tablet Take 1 tablet (10 mg total) by mouth 2 (two) times daily as needed for muscle spasms. 09/19/15   Mackuen, Courteney Lyn, MD  ibuprofen (ADVIL,MOTRIN) 800 MG tablet Take 1 tablet (800 mg total) by mouth 3  (three) times daily. 09/19/15   Mackuen, Courteney Lyn, MD  naproxen (NAPROSYN) 500 MG tablet Take 1 tablet (500 mg total) by mouth 2 (two) times daily with a meal. 02/04/15   Arthor Captain, PA-C  oxyCODONE-acetaminophen (PERCOCET/ROXICET) 5-325 MG tablet Take 1 tablet by mouth every 6 (six) hours as needed for severe pain. 09/19/15   Mackuen, Cindee Salt, MD    Allergies    Patient has no known allergies.  Review of Systems   Review of Systems  Constitutional: Negative for chills and fever.  HENT: Negative for congestion.   Respiratory: Negative for shortness of breath.   Cardiovascular: Negative for chest pain.  Gastrointestinal: Negative for abdominal pain.  Genitourinary: Negative for enuresis.  Musculoskeletal: Negative for back pain.       Left foot pain.  Skin: Negative for rash.  Neurological: Negative for headaches.  Hematological: Does not bruise/bleed easily.    Physical Exam Updated Vital Signs BP 124/90   Pulse 94   Temp 97.7 F (36.5 C) (Oral)   Resp 18   Ht 5\' 11"  (1.803 m)   Wt 68 kg   SpO2 100%   BMI 20.91 kg/m   Physical Exam Vitals and nursing note reviewed.  Constitutional:      General: He is not in acute distress.    Appearance: He is not ill-appearing.  HENT:     Head: Normocephalic and atraumatic.  Nose: No congestion.  Eyes:     Conjunctiva/sclera: Conjunctivae normal.  Cardiovascular:     Rate and Rhythm: Normal rate and regular rhythm.  Pulmonary:     Effort: Pulmonary effort is normal.  Musculoskeletal:        General: Tenderness present.     Right lower leg: No edema.     Left lower leg: No edema.     Comments: Patient's left foot was visualized there is no edema, erythema, lacerations abrasions or other gross abnormalities noted.  He had full range of motion his toes, ankle, knee.  He is tender to palpation along his first metatarsal, no gross deformities or crepitus noted.  Neurovascular fully intact, compartments are soft.    Skin:    General: Skin is warm and dry.  Neurological:     Mental Status: He is alert.  Psychiatric:        Mood and Affect: Mood normal.     ED Results / Procedures / Treatments   Labs (all labs ordered are listed, but only abnormal results are displayed) Labs Reviewed - No data to display  EKG None  Radiology DG Foot Complete Left  Result Date: 02/22/2020 CLINICAL DATA:  Status post trauma. EXAM: LEFT FOOT - COMPLETE 3+ VIEW COMPARISON:  None. FINDINGS: There is no evidence of fracture or dislocation. There is no evidence of arthropathy or other focal bone abnormality. Soft tissues are unremarkable. IMPRESSION: Negative. Electronically Signed   By: Aram Candela M.D.   On: 02/22/2020 15:15    Procedures Procedures (including critical care time)  Medications Ordered in ED Medications - No data to display  ED Course  I have reviewed the triage vital signs and the nursing notes.  Pertinent labs & imaging results that were available during my care of the patient were reviewed by me and considered in my medical decision making (see chart for details).    MDM Rules/Calculators/A&P                          Patient presents with left foot pain.  He is alert, does not appear in acute distress, vital signs reassuring.  Will obtain x-ray for further evaluation.  Imaging does not reveal any acute findings.    Low suspicion for fracture or dislocation as x-ray does not reveal any significant findings. low suspicion for ligament or tendon damage as area was palpated no gross defects noted, t he had full range of motion of his left foot.  Low suspicion for compartment syndrome as area was palpated it was soft to the touch, neurovascular fully intact.  Suspect patient has muscular strain versus contusion of foot.  Will recommend over-the-counter pain medications, rest, ice and/or heat and follow-up with PCP for further evaluation.  Vital signs have remained stable, no indication  for hospital admission. Patient given at home care as well strict return precautions.  Patient verbalized that they understood agreed to said plan.   Final Clinical Impression(s) / ED Diagnoses Final diagnoses:  Injury of left foot, initial encounter    Rx / DC Orders ED Discharge Orders    None       Carroll Sage, PA-C 02/22/20 1726    Arby Barrette, MD 02/23/20 2129

## 2020-02-22 NOTE — ED Triage Notes (Signed)
Left foot injury. He steps on a loose brick that was on his porch. He fell and brick landed on his foot while wearing flip flops.

## 2020-05-30 ENCOUNTER — Encounter (HOSPITAL_BASED_OUTPATIENT_CLINIC_OR_DEPARTMENT_OTHER): Payer: Self-pay | Admitting: *Deleted

## 2020-05-30 ENCOUNTER — Other Ambulatory Visit: Payer: Self-pay

## 2020-05-30 ENCOUNTER — Emergency Department (HOSPITAL_BASED_OUTPATIENT_CLINIC_OR_DEPARTMENT_OTHER): Payer: BC Managed Care – PPO

## 2020-05-30 ENCOUNTER — Emergency Department (HOSPITAL_BASED_OUTPATIENT_CLINIC_OR_DEPARTMENT_OTHER)
Admission: EM | Admit: 2020-05-30 | Discharge: 2020-05-30 | Disposition: A | Discharge status: Home / Self Care | Payer: BC Managed Care – PPO | Attending: Emergency Medicine | Admitting: Emergency Medicine

## 2020-05-30 DIAGNOSIS — R110 Nausea: Secondary | ICD-10-CM | POA: Diagnosis not present

## 2020-05-30 DIAGNOSIS — F17210 Nicotine dependence, cigarettes, uncomplicated: Secondary | ICD-10-CM | POA: Insufficient documentation

## 2020-05-30 DIAGNOSIS — R5383 Other fatigue: Secondary | ICD-10-CM | POA: Diagnosis not present

## 2020-05-30 DIAGNOSIS — R197 Diarrhea, unspecified: Secondary | ICD-10-CM | POA: Diagnosis not present

## 2020-05-30 DIAGNOSIS — R0781 Pleurodynia: Secondary | ICD-10-CM | POA: Diagnosis present

## 2020-05-30 DIAGNOSIS — R1011 Right upper quadrant pain: Secondary | ICD-10-CM | POA: Insufficient documentation

## 2020-05-30 LAB — COMPREHENSIVE METABOLIC PANEL
ALT: 24 U/L (ref 0–44)
AST: 23 U/L (ref 15–41)
Albumin: 4.3 g/dL (ref 3.5–5.0)
Alkaline Phosphatase: 55 U/L (ref 38–126)
Anion gap: 8 (ref 5–15)
BUN: 12 mg/dL (ref 6–20)
CO2: 23 mmol/L (ref 22–32)
Calcium: 8.9 mg/dL (ref 8.9–10.3)
Chloride: 105 mmol/L (ref 98–111)
Creatinine, Ser: 0.97 mg/dL (ref 0.61–1.24)
GFR, Estimated: >60 mL/min (ref >60)
Glucose, Bld: 95 mg/dL (ref 70–99)
Potassium: 4.4 mmol/L (ref 3.5–5.1)
Sodium: 136 mmol/L (ref 135–145)
Total Bilirubin: 0.4 mg/dL (ref 0.3–1.2)
Total Protein: 7.6 g/dL (ref 6.5–8.1)

## 2020-05-30 LAB — CBC WITH DIFFERENTIAL/PLATELET
Abs Immature Granulocytes: 0.01 10*3/uL (ref 0.00–0.07)
Basophils Absolute: 0 10*3/uL (ref 0.0–0.1)
Basophils Relative: 0 %
Eosinophils Absolute: 0.2 10*3/uL (ref 0.0–0.5)
Eosinophils Relative: 5 %
HCT: 47.5 % (ref 39.0–52.0)
Hemoglobin: 16 g/dL (ref 13.0–17.0)
Immature Granulocytes: 0 %
Lymphocytes Relative: 37 %
Lymphs Abs: 1.4 10*3/uL (ref 0.7–4.0)
MCH: 30.4 pg (ref 26.0–34.0)
MCHC: 33.7 g/dL (ref 30.0–36.0)
MCV: 90.1 fL (ref 80.0–100.0)
Monocytes Absolute: 0.6 10*3/uL (ref 0.1–1.0)
Monocytes Relative: 15 %
Neutro Abs: 1.6 10*3/uL — ABNORMAL LOW (ref 1.7–7.7)
Neutrophils Relative %: 43 %
Platelets: 179 10*3/uL (ref 150–400)
RBC: 5.27 MIL/uL (ref 4.22–5.81)
RDW: 14.1 % (ref 11.5–15.5)
WBC: 3.8 10*3/uL — ABNORMAL LOW (ref 4.0–10.5)
nRBC: 0 % (ref 0.0–0.2)

## 2020-05-30 LAB — LIPASE, BLOOD: Lipase: 40 U/L (ref 11–51)

## 2020-05-30 MED ORDER — ONDANSETRON 4 MG PO TBDP: 4.0000 mg | ORAL_TABLET | Freq: Three times a day (TID) | ORAL | 0 refills | Status: AC | PRN

## 2020-05-30 MED ORDER — ACETAMINOPHEN 325 MG PO TABS
650.0000 mg | ORAL_TABLET | Freq: Once | ORAL | Status: AC
  Administered 2020-05-30: 650 mg via ORAL
  Filled 2020-05-30: qty 2

## 2020-05-30 MED ORDER — ONDANSETRON 4 MG PO TBDP
8.0000 mg | ORAL_TABLET | Freq: Once | ORAL | Status: AC
  Administered 2020-05-30: 8 mg via ORAL
  Filled 2020-05-30: qty 2

## 2020-05-30 MED ORDER — LOPERAMIDE HCL 2 MG PO CAPS: 2.0000 mg | ORAL_CAPSULE | Freq: Four times a day (QID) | ORAL | 0 refills | Status: AC | PRN

## 2020-05-30 NOTE — Discharge Instructions (Addendum)
Your lab work today was reassuring.  Please drink plenty of fluids.  Use Zofran as needed for nausea.  Please use loperamide which is an over-the-counter medication for diarrhea.  Follow-up with your primary care doctor.  You may always return to the ER for any new or concerning symptoms.  Please use Tylenol or ibuprofen for pain.  You may use 600 mg ibuprofen every 6 hours or 1000 mg of Tylenol every 6 hours.  You may choose to alternate between the 2.  This would be most effective.  Not to exceed 4 g of Tylenol within 24 hours.  Not to exceed 3200 mg ibuprofen 24 hours.  Take plenty of deep breaths
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
## 2020-06-20 ENCOUNTER — Ambulatory Visit (HOSPITAL_COMMUNITY)
Admission: EM | Admit: 2020-06-20 | Discharge: 2020-06-20 | Disposition: A | Discharge status: Home / Self Care | Payer: BC Managed Care – PPO | Attending: Emergency Medicine | Admitting: Emergency Medicine

## 2020-06-20 ENCOUNTER — Encounter (HOSPITAL_COMMUNITY): Payer: Self-pay | Admitting: Emergency Medicine

## 2020-06-20 ENCOUNTER — Other Ambulatory Visit: Payer: Self-pay

## 2020-06-20 ENCOUNTER — Ambulatory Visit (INDEPENDENT_AMBULATORY_CARE_PROVIDER_SITE_OTHER): Payer: BC Managed Care – PPO

## 2020-06-20 DIAGNOSIS — M25531 Pain in right wrist: Secondary | ICD-10-CM | POA: Diagnosis not present

## 2020-06-20 MED ORDER — CYCLOBENZAPRINE HCL 10 MG PO TABS: 10.0000 mg | ORAL_TABLET | Freq: Two times a day (BID) | ORAL | 0 refills | Status: DC | PRN

## 2020-06-20 MED ORDER — IBUPROFEN 800 MG PO TABS: 800.0000 mg | ORAL_TABLET | Freq: Three times a day (TID) | ORAL | 0 refills | Status: AC

## 2020-06-20 MED ORDER — IBUPROFEN 800 MG PO TABS: 800.0000 mg | ORAL_TABLET | Freq: Three times a day (TID) | ORAL | 0 refills | Status: DC

## 2020-06-20 MED ORDER — CYCLOBENZAPRINE HCL 10 MG PO TABS: 10.0000 mg | ORAL_TABLET | Freq: Two times a day (BID) | ORAL | 0 refills | Status: AC | PRN

## 2020-06-20 NOTE — ED Triage Notes (Signed)
Pt presents today with pain to right wrist. He is unsure of specific injury, but does report working on car yesterday. +ROM, denies swelling

## 2020-06-20 NOTE — Discharge Instructions (Addendum)
X-ray today was negative for injury  Use 800 mg of ibuprofen 3 times a day for the next 3 to 5 days then as needed to help reduce swelling which will in turn help reduce pain  Can use muscle relaxer twice a day for additional comfort, be mindful of this medication may make you drowsy  Has swelling and pain decreases began to rotate wrist bend and flex as tolerate   Can use heating pad 15-minute intervals for additional comfort   Plan follow-up for persistent pain, persistent swelling and decreased movement
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
## 2020-10-25 ENCOUNTER — Emergency Department (HOSPITAL_BASED_OUTPATIENT_CLINIC_OR_DEPARTMENT_OTHER)
Admission: EM | Admit: 2020-10-25 | Discharge: 2020-10-25 | Disposition: A | Discharge status: Home / Self Care | Payer: BC Managed Care – PPO | Attending: Emergency Medicine | Admitting: Emergency Medicine

## 2020-10-25 ENCOUNTER — Other Ambulatory Visit: Payer: Self-pay

## 2020-10-25 ENCOUNTER — Encounter (HOSPITAL_BASED_OUTPATIENT_CLINIC_OR_DEPARTMENT_OTHER): Payer: Self-pay

## 2020-10-25 DIAGNOSIS — R195 Other fecal abnormalities: Secondary | ICD-10-CM | POA: Insufficient documentation

## 2020-10-25 DIAGNOSIS — M545 Low back pain, unspecified: Secondary | ICD-10-CM | POA: Insufficient documentation

## 2020-10-25 DIAGNOSIS — Z5321 Procedure and treatment not carried out due to patient leaving prior to being seen by health care provider: Secondary | ICD-10-CM | POA: Insufficient documentation

## 2020-10-25 DIAGNOSIS — R109 Unspecified abdominal pain: Secondary | ICD-10-CM | POA: Insufficient documentation

## 2020-10-25 LAB — URINALYSIS, ROUTINE W REFLEX MICROSCOPIC
Bilirubin Urine: NEGATIVE
Glucose, UA: NEGATIVE mg/dL
Hgb urine dipstick: NEGATIVE
Ketones, ur: NEGATIVE mg/dL
Leukocytes,Ua: NEGATIVE
Nitrite: NEGATIVE
Protein, ur: NEGATIVE mg/dL
Specific Gravity, Urine: 1.015 (ref 1.005–1.030)
pH: 7.5 (ref 5.0–8.0)

## 2020-10-25 LAB — CBC
HCT: 43.5 % (ref 39.0–52.0)
Hemoglobin: 14.9 g/dL (ref 13.0–17.0)
MCH: 30.6 pg (ref 26.0–34.0)
MCHC: 34.3 g/dL (ref 30.0–36.0)
MCV: 89.3 fL (ref 80.0–100.0)
Platelets: 191 10*3/uL (ref 150–400)
RBC: 4.87 MIL/uL (ref 4.22–5.81)
RDW: 13.5 % (ref 11.5–15.5)
WBC: 4.8 10*3/uL (ref 4.0–10.5)
nRBC: 0 % (ref 0.0–0.2)

## 2020-10-25 LAB — COMPREHENSIVE METABOLIC PANEL
ALT: 19 U/L (ref 0–44)
AST: 25 U/L (ref 15–41)
Albumin: 4.2 g/dL (ref 3.5–5.0)
Alkaline Phosphatase: 61 U/L (ref 38–126)
Anion gap: 7 (ref 5–15)
BUN: 14 mg/dL (ref 6–20)
CO2: 29 mmol/L (ref 22–32)
Calcium: 9 mg/dL (ref 8.9–10.3)
Chloride: 104 mmol/L (ref 98–111)
Creatinine, Ser: 1.26 mg/dL — ABNORMAL HIGH (ref 0.61–1.24)
GFR, Estimated: >60 mL/min (ref >60)
Glucose, Bld: 94 mg/dL (ref 70–99)
Potassium: 4 mmol/L (ref 3.5–5.1)
Sodium: 140 mmol/L (ref 135–145)
Total Bilirubin: 0.5 mg/dL (ref 0.3–1.2)
Total Protein: 7.4 g/dL (ref 6.5–8.1)

## 2020-10-25 NOTE — ED Triage Notes (Signed)
Pt c/o left flank/lower back pain x 8 days-denies injury-states he had one episode of bloody stool-NAD-steady gait

## 2020-11-16 DEATH — deceased
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
## 2021-06-05 IMAGING — DX DG WRIST COMPLETE 3+V*R*
4 series · 4 of 4 positions shown · non-contrast
Comparison: None.

CLINICAL DATA: Right wrist pain, decreased range of motion

EXAM:
RIGHT WRIST - COMPLETE 3+ VIEW

[wrist pa]
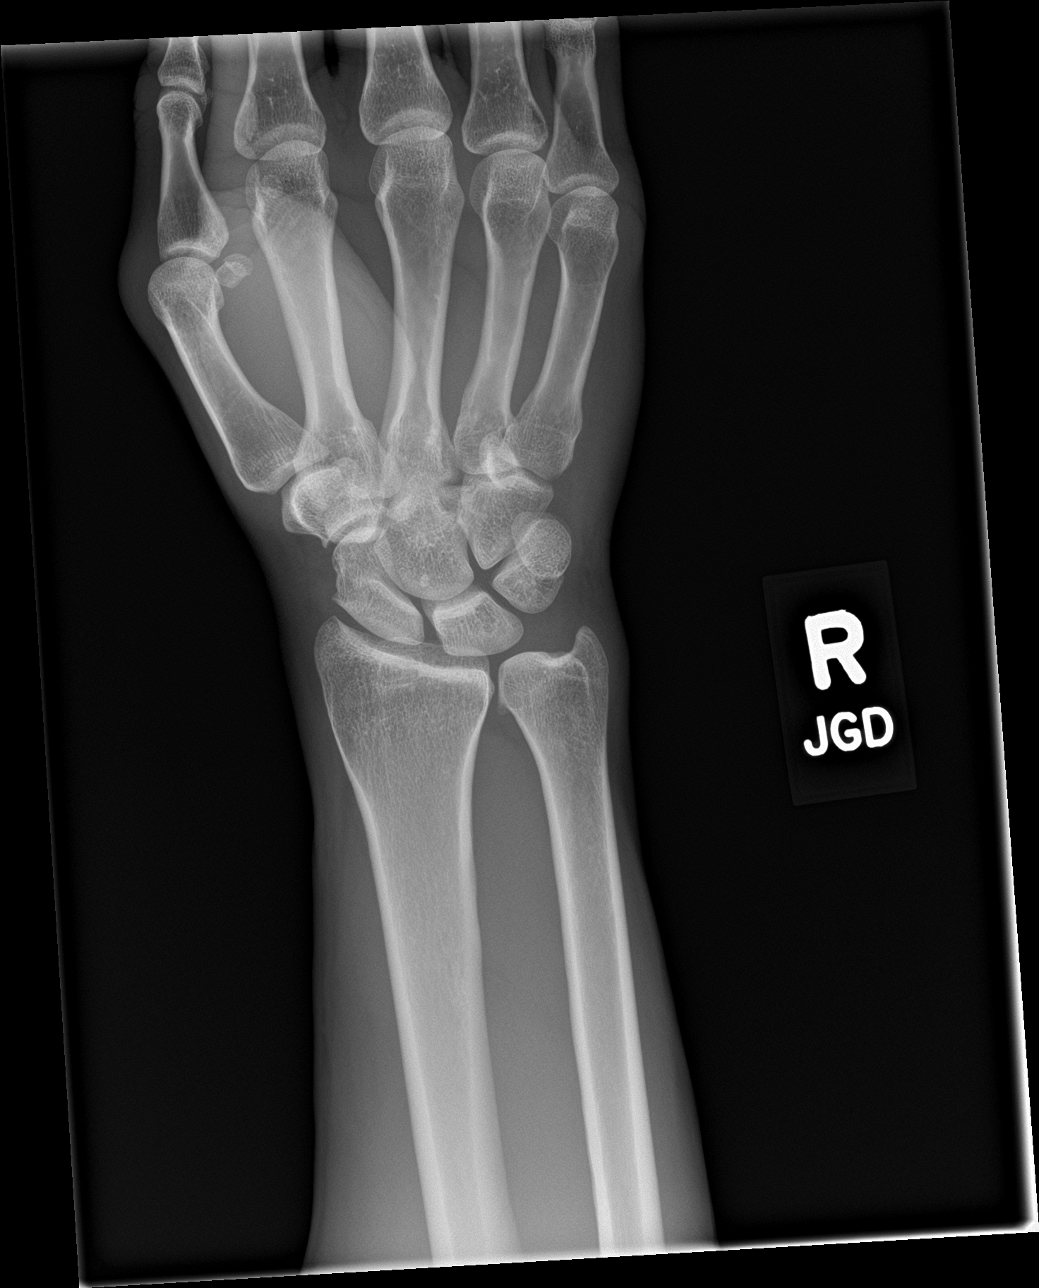

[wrist navicular]
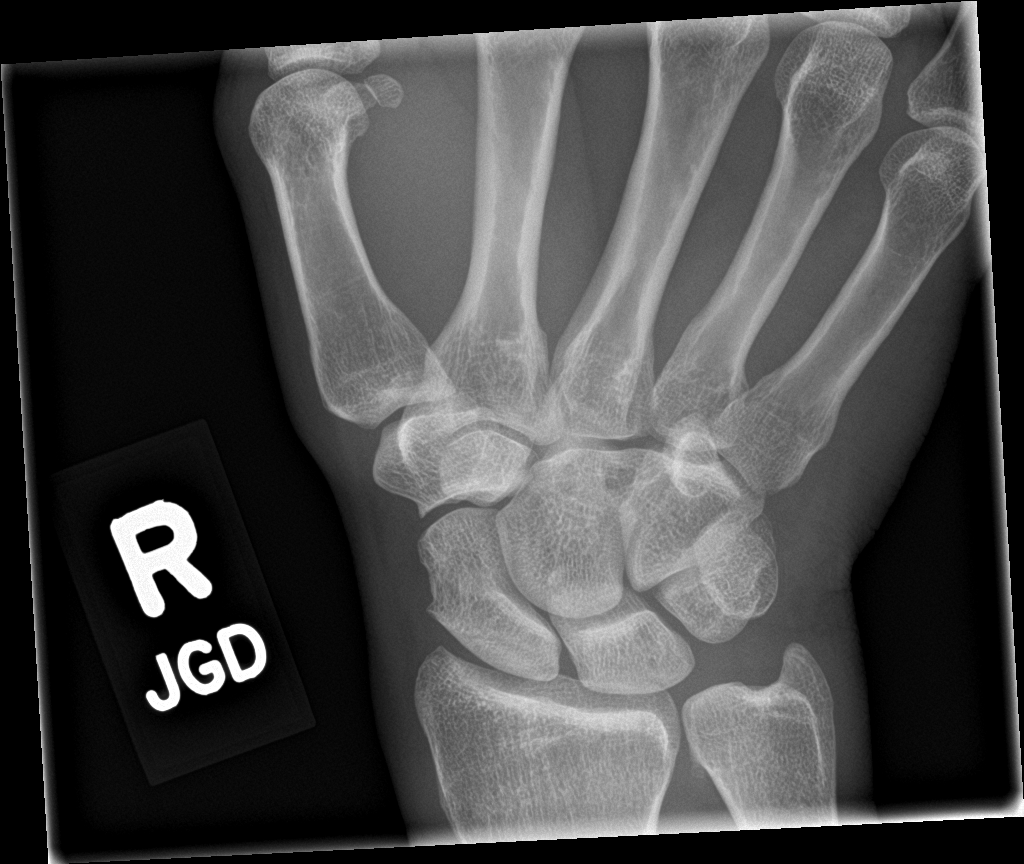

[wrist obl]
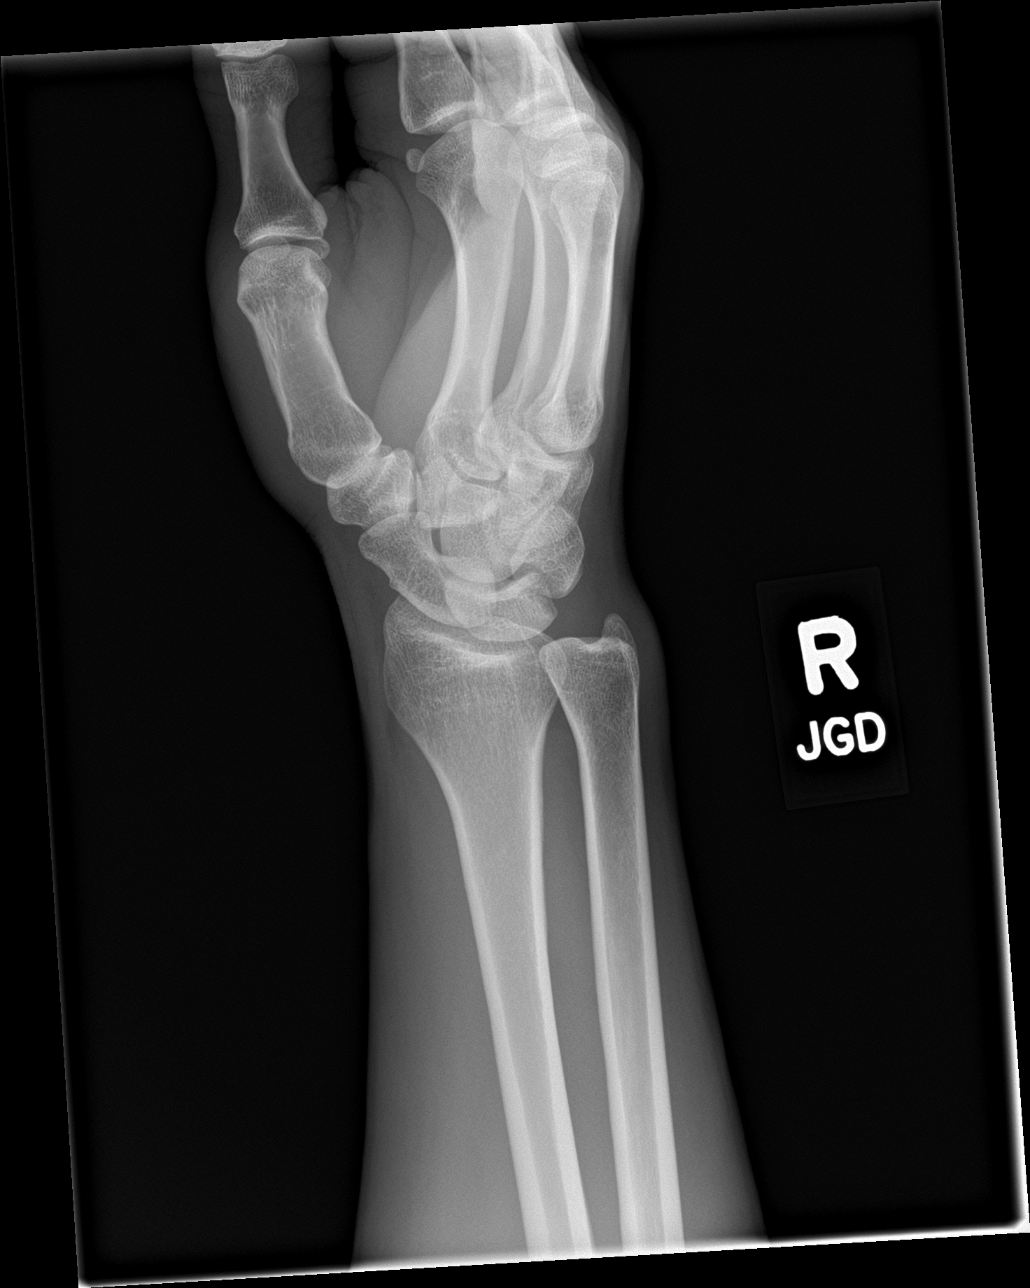

[wrist lat]
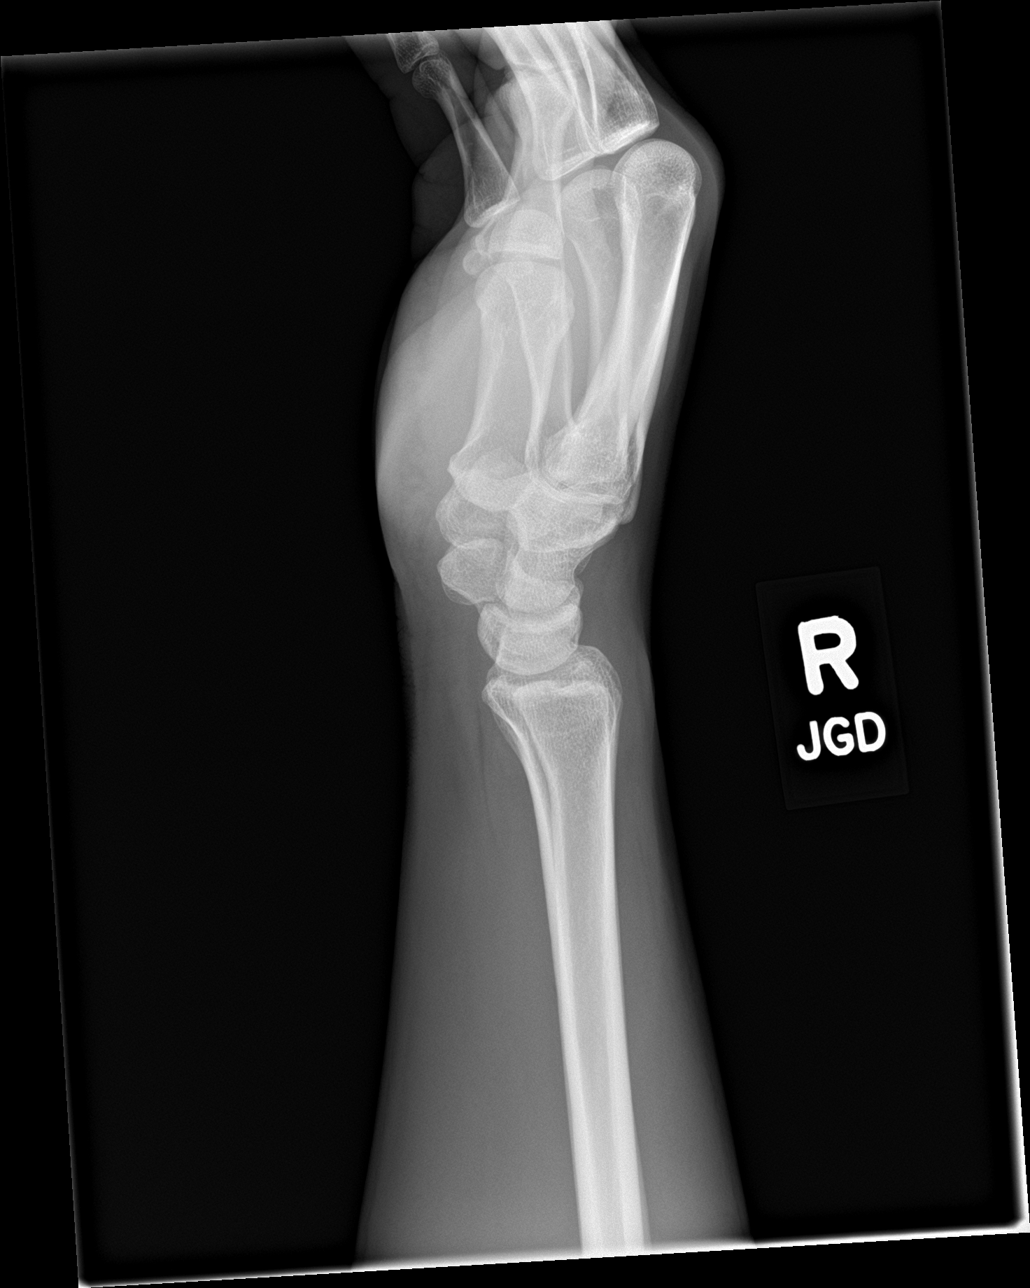

[4 of 4 positions shown; findings below may reference images not displayed]

FINDINGS: There is no evidence of fracture or dislocation. There is no
evidence of arthropathy or other focal bone abnormality. Soft
tissues are unremarkable.
IMPRESSION: Negative.
